# Patient Record
Sex: Male | Born: 1990
Health system: Southern US, Community
[De-identification: ages and names within clinical notes are randomized; demographics above are authoritative.]

## PROBLEM LIST (undated history)

## (undated) DIAGNOSIS — N200 Calculus of kidney: Secondary | ICD-10-CM

---
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
## 2015-11-22 ENCOUNTER — Encounter (HOSPITAL_COMMUNITY): Payer: Self-pay | Admitting: Emergency Medicine

## 2015-11-22 ENCOUNTER — Emergency Department (HOSPITAL_COMMUNITY)
Admission: EM | Admit: 2015-11-22 | Discharge: 2015-11-22 | Disposition: A | Discharge status: Home / Self Care | Payer: Self-pay | Attending: Emergency Medicine | Admitting: Emergency Medicine

## 2015-11-22 ENCOUNTER — Emergency Department (HOSPITAL_COMMUNITY): Payer: Self-pay

## 2015-11-22 DIAGNOSIS — N23 Unspecified renal colic: Secondary | ICD-10-CM | POA: Insufficient documentation

## 2015-11-22 HISTORY — DX: Calculus of kidney: N20.0

## 2015-11-22 LAB — CBC
HCT: 42.7 % (ref 39.0–52.0)
Hemoglobin: 14.6 g/dL (ref 13.0–17.0)
MCH: 30.9 pg (ref 26.0–34.0)
MCHC: 34.2 g/dL (ref 30.0–36.0)
MCV: 90.3 fL (ref 78.0–100.0)
Platelets: 290 10*3/uL (ref 150–400)
RBC: 4.73 MIL/uL (ref 4.22–5.81)
RDW: 12.5 % (ref 11.5–15.5)
WBC: 14.7 10*3/uL — ABNORMAL HIGH (ref 4.0–10.5)

## 2015-11-22 LAB — URINE MICROSCOPIC-ADD ON: SQUAMOUS EPITHELIAL / LPF: NONE SEEN

## 2015-11-22 LAB — LIPASE, BLOOD: Lipase: 15 U/L (ref 11–51)

## 2015-11-22 LAB — URINALYSIS, ROUTINE W REFLEX MICROSCOPIC
Bilirubin Urine: NEGATIVE
Glucose, UA: NEGATIVE mg/dL
Ketones, ur: >80 mg/dL — AB
Leukocytes, UA: NEGATIVE
Nitrite: NEGATIVE
Protein, ur: NEGATIVE mg/dL
Specific Gravity, Urine: 1.033 — ABNORMAL HIGH (ref 1.005–1.030)
pH: 8 (ref 5.0–8.0)

## 2015-11-22 LAB — BASIC METABOLIC PANEL
Anion gap: 12 (ref 5–15)
BUN: 9 mg/dL (ref 6–20)
CO2: 22 mmol/L (ref 22–32)
Calcium: 9.4 mg/dL (ref 8.9–10.3)
Chloride: 104 mmol/L (ref 101–111)
Creatinine, Ser: 1.21 mg/dL (ref 0.61–1.24)
GFR calc Af Amer: >60 mL/min (ref >60)
GFR calc non Af Amer: >60 mL/min (ref >60)
Glucose, Bld: 119 mg/dL — ABNORMAL HIGH (ref 65–99)
Potassium: 3.2 mmol/L — ABNORMAL LOW (ref 3.5–5.1)
Sodium: 138 mmol/L (ref 135–145)

## 2015-11-22 LAB — HEPATIC FUNCTION PANEL
ALK PHOS: 63 U/L (ref 38–126)
ALT: 15 U/L — AB (ref 17–63)
AST: 21 U/L (ref 15–41)
Albumin: 4.9 g/dL (ref 3.5–5.0)
BILIRUBIN INDIRECT: 1.2 mg/dL — AB (ref 0.3–0.9)
BILIRUBIN TOTAL: 1.4 mg/dL — AB (ref 0.3–1.2)
Bilirubin, Direct: 0.2 mg/dL (ref 0.1–0.5)
TOTAL PROTEIN: 7.4 g/dL (ref 6.5–8.1)

## 2015-11-22 MED ORDER — HYDROCODONE-ACETAMINOPHEN 5-325 MG PO TABS: 1.0000 | ORAL_TABLET | Freq: Four times a day (QID) | ORAL | 0 refills | Status: AC | PRN

## 2015-11-22 MED ORDER — IOPAMIDOL (ISOVUE-300) INJECTION 61%
100.0000 mL | Freq: Once | INTRAVENOUS | Status: AC | PRN
  Administered 2015-11-22: 100 mL via INTRAVENOUS

## 2015-11-22 MED ORDER — IBUPROFEN 800 MG PO TABS: 800.0000 mg | ORAL_TABLET | Freq: Three times a day (TID) | ORAL | 0 refills | Status: DC | PRN

## 2015-11-22 MED ORDER — METOCLOPRAMIDE HCL 5 MG/ML IJ SOLN
5.0000 mg | Freq: Once | INTRAMUSCULAR | Status: AC
  Administered 2015-11-22: 5 mg via INTRAVENOUS
  Filled 2015-11-22: qty 2

## 2015-11-22 MED ORDER — TAMSULOSIN HCL 0.4 MG PO CAPS: 0.4000 mg | ORAL_CAPSULE | Freq: Every day | ORAL | 0 refills | Status: AC

## 2015-11-22 MED ORDER — SODIUM CHLORIDE 0.9 % IV BOLUS (SEPSIS)
1000.0000 mL | Freq: Once | INTRAVENOUS | Status: AC
  Administered 2015-11-22: 1000 mL via INTRAVENOUS

## 2015-11-22 MED ORDER — POTASSIUM CHLORIDE CRYS ER 20 MEQ PO TBCR
40.0000 meq | EXTENDED_RELEASE_TABLET | Freq: Once | ORAL | Status: AC
  Administered 2015-11-22: 40 meq via ORAL
  Filled 2015-11-22: qty 2

## 2015-11-22 MED ORDER — HYDROMORPHONE HCL 1 MG/ML IJ SOLN
1.0000 mg | Freq: Once | INTRAMUSCULAR | Status: AC
  Administered 2015-11-22: 1 mg via INTRAVENOUS
  Filled 2015-11-22: qty 1

## 2015-11-22 MED ORDER — KETOROLAC TROMETHAMINE 30 MG/ML IJ SOLN
30.0000 mg | Freq: Once | INTRAMUSCULAR | Status: AC
  Administered 2015-11-22: 30 mg via INTRAVENOUS
  Filled 2015-11-22: qty 1

## 2015-11-22 NOTE — ED Notes (Signed)
Bed: WA17 Expected date: 11/22/15 Expected time: 11:28 AM Means of arrival: Ambulance Comments: Flank Pain

## 2015-11-22 NOTE — ED Triage Notes (Signed)
Patient from home woke up with flank and abdominal pain this morning.  He urinated once and saw a small amount of blood and has not been able to urinate except for small amounts since.  History of kidney stones, patient has nausea but has not vomited.

## 2015-11-22 NOTE — ED Provider Notes (Signed)
[facility]-EMERGENCY DEPT Provider Note   CSN: [id number] Arrival date & time: 11/22/15  1133     History   Chief Complaint Chief Complaint  Patient presents with  . Flank Pain    HPI William Parrish is a 25 y.o. male.Complains of left-sided abdominal pain rating to left back onset 8 AM today. Pain is worse with movement or changing positions not improved by anything. He's vomited one time since onset of pain. He thinks he may have had a slight amount of hematuria. Denies other associated symptoms. No treatment prior to coming here. He's never had similar pain in the past. Brought by EMS  HPI  Past Medical History:  Diagnosis Date  . Kidney stones     There are no active problems to display for this patient.   No past surgical history on file.     Home Medications    Prior to Admission medications   Not on File    Family History No family history on file.  Social History Social History  Substance Use Topics  . Smoking status: Not on file  . Smokeless tobacco: Not on file  . Alcohol use Not on file   Admits to smoking, admits to cocaine use last time last night, admits to alcoholic last time last night  Allergies   Review of patient's allergies indicates not on file.   Review of Systems Review of Systems  Constitutional: Negative.   HENT: Negative.   Respiratory: Negative.   Cardiovascular: Negative.   Gastrointestinal: Positive for abdominal pain and vomiting.       Vomited one time no hematemesis  Genitourinary: Positive for flank pain.  Skin: Negative.   Neurological: Negative.   Psychiatric/Behavioral: Negative.   All other systems reviewed and are negative.    Physical Exam Updated Vital Signs BP 131/81 (BP Location: Left Arm)   Pulse 68   Temp 97.6 F (36.4 C) (Oral)   Resp 18   SpO2 100%   Physical Exam  Constitutional: He appears well-developed and well-nourished. He appears distressed.  Alert Glasgow Coma Score 15 appears  uncomfortable  HENT:  Head: Normocephalic and atraumatic.  Eyes: Conjunctivae are normal. Pupils are equal, round, and reactive to light.  Neck: Neck supple. No tracheal deviation present. No thyromegaly present.  Cardiovascular: Normal rate and regular rhythm.   No murmur heard. Pulmonary/Chest: Effort normal and breath sounds normal.  Abdominal: Soft. Bowel sounds are normal. He exhibits no distension and no mass. There is tenderness. There is no rebound and no guarding.  Tender at left upper quadrant and left lower quadrant  Genitourinary: Penis normal.  Genitourinary Comments: Normal male genitalia  Musculoskeletal: Normal range of motion. He exhibits no edema or tenderness.  Neurological: He is alert. Coordination normal.  Skin: Skin is warm and dry. No rash noted.  Psychiatric: He has a normal mood and affect.  Nursing note and vitals reviewed.  Results for orders placed or performed during the hospital encounter of 11/22/15  Urinalysis, Routine w reflex microscopic- may I&O cath if menses  Result Value Ref Range   Color, Urine YELLOW YELLOW   APPearance TURBID (A) CLEAR   Specific Gravity, Urine 1.033 (H) 1.005 - 1.030   pH 8.0 5.0 - 8.0   Glucose, UA NEGATIVE NEGATIVE mg/dL   Hgb urine dipstick LARGE (A) NEGATIVE   Bilirubin Urine NEGATIVE NEGATIVE   Ketones, ur >80 (A) NEGATIVE mg/dL   Protein, ur NEGATIVE NEGATIVE mg/dL   Nitrite NEGATIVE NEGATIVE  Leukocytes, UA NEGATIVE NEGATIVE  Basic metabolic panel  Result Value Ref Range   Sodium 138 135 - 145 mmol/L   Potassium 3.2 (L) 3.5 - 5.1 mmol/L   Chloride 104 101 - 111 mmol/L   CO2 22 22 - 32 mmol/L   Glucose, Bld 119 (H) 65 - 99 mg/dL   BUN 9 6 - 20 mg/dL   Creatinine, Ser 1.611.21 0.61 - 1.24 mg/dL   Calcium 9.4 8.9 - 09.610.3 mg/dL   GFR calc non Af Amer >60 >60 mL/min   GFR calc Af Amer >60 >60 mL/min   Anion gap 12 5 - 15  CBC  Result Value Ref Range   WBC 14.7 (H) 4.0 - 10.5 K/uL   RBC 4.73 4.22 - 5.81 MIL/uL    Hemoglobin 14.6 13.0 - 17.0 g/dL   HCT 04.542.7 40.939.0 - 81.152.0 %   MCV 90.3 78.0 - 100.0 fL   MCH 30.9 26.0 - 34.0 pg   MCHC 34.2 30.0 - 36.0 g/dL   RDW 91.412.5 78.211.5 - 95.615.5 %   Platelets 290 150 - 400 K/uL  Lipase, blood  Result Value Ref Range   Lipase 15 11 - 51 U/L  Hepatic function panel  Result Value Ref Range   Total Protein 7.4 6.5 - 8.1 g/dL   Albumin 4.9 3.5 - 5.0 g/dL   AST 21 15 - 41 U/L   ALT 15 (L) 17 - 63 U/L   Alkaline Phosphatase 63 38 - 126 U/L   Total Bilirubin 1.4 (H) 0.3 - 1.2 mg/dL   Bilirubin, Direct 0.2 0.1 - 0.5 mg/dL   Indirect Bilirubin 1.2 (H) 0.3 - 0.9 mg/dL  Urine microscopic-add on  Result Value Ref Range   Squamous Epithelial / LPF NONE SEEN NONE SEEN   WBC, UA 0-5 0 - 5 WBC/hpf   RBC / HPF TOO NUMEROUS TO COUNT 0 - 5 RBC/hpf   Bacteria, UA MANY (A) NONE SEEN   Urine-Other AMORPHOUS URATES/PHOSPHATES    Ct Abdomen Pelvis W Contrast  Result Date: 11/22/2015 CLINICAL DATA:  Left flank and abdominal pain. Mild hematuria. History of kidney stones. EXAM: CT ABDOMEN AND PELVIS WITH CONTRAST TECHNIQUE: Multidetector CT imaging of the abdomen and pelvis was performed using the standard protocol following bolus administration of intravenous contrast. CONTRAST:  100mL ISOVUE-300 IOPAMIDOL (ISOVUE-300) INJECTION 61% COMPARISON:  None. FINDINGS: Lower chest: No acute abnormality. Hepatobiliary: Periportal edema is identified. The hepatic veins are not opacified. The portal vein is normal. The gallbladder is unremarkable. A few tiny cysts are seen in the liver. 13 mm of enhancement is seen in the liver on series 2, image 26, likely flash filling within the hemangioma or a vascular anomaly. The attenuation of the liver is heterogeneous. Pancreas: Unremarkable. No pancreatic ductal dilatation or surrounding inflammatory changes. Spleen: Normal in size without focal abnormality. Adrenals/Urinary Tract: A 3 mm stone is seen in the lower left kidney on coronal image 82. No other  renal stones. There is mild hydronephrosis on the left without significant perinephric stranding. There is prominence of the left ureter as well. There is a 4 mm stone just within the left side of the bladder. The bladder is otherwise normal. No obstruction on the right. Stomach/Bowel: Stomach is within normal limits. Appendix appears normal. No evidence of bowel wall thickening, distention, or inflammatory changes. The appendix is normal. Vascular/Lymphatic: The iliac veins and IVC are prominent. No evidence of obstruction. The renal veins are also prominent. The abdominal aorta is normal  in appearance. No adenopathy. Reproductive: Prostate is unremarkable. Other: No abdominal wall hernia or abnormality. No abdominopelvic ascites. Musculoskeletal: No acute or significant osseous findings. IMPRESSION: 1. 4 mm stone just in the left side of the bladder, next to the UVJ, consistent with a recently passed stone. This explains the mild left hydronephrosis and left flank pain. 2. Heterogeneous attenuation of the liver with periportal edema. Given a normal size heart and the prominence of the venous system, this could be due to aggressive hydration. Recommend clinical correlation. 3. 13 mm enhancing lesion in the liver as described above is nonspecific but may represent a flash filling hemangioma or a vascular anomaly. Electronically Signed   By: Gerome Geovanni Rahming III M.D   On: 11/22/2015 13:37    ED Treatments / Results  Labs (all labs ordered are listed, but only abnormal results are displayed) Labs Reviewed  URINALYSIS, ROUTINE W REFLEX MICROSCOPIC (NOT AT [facility])  BASIC METABOLIC PANEL  CBC  LIPASE, BLOOD  HEPATIC FUNCTION PANEL    EKG  EKG Interpretation None       Radiology No results found.  Procedures Procedures (including critical care time)  Medications Ordered in ED Medications  HYDROmorphone (DILAUDID) injection 1 mg (not administered)  sodium chloride 0.9 % bolus 1,000 mL (not  administered)  metoCLOPramide (REGLAN) injection 5 mg (not administered)     Initial Impression / Assessment and Plan / ED Course  I have reviewed the triage vital signs and the nursing notes. 4:10 PM patient resting comfortably after treatment with intravenous Reglan, , intravenous opioids and IV Toradol. He feels ready to go home. He also received oral potassium supplement Pertinent labs & imaging results that were available during my care of the patient were reviewed by me and considered in my medical decision making (see chart for details).  Clinical Course  Plan prescriptions ibuprofen, Norco, Flomax, urine strainer, referral [facility]    Final Clinical Impressions(s) / ED Diagnoses  Diagnosis #1 ureteral colic #2 hypokalemia Final diagnoses:  None    New Prescriptions New Prescriptions   No medications on file     Doug Sou, MD 11/22/15 [phone]

## 2015-11-22 NOTE — Discharge Instructions (Signed)
Strain all urine if you catch a stone, save it and give it to the urologist. Call [facility] in 2 days to schedule next available appointment. Return if your pain is not well controlled with the medications prescribed

## 2015-11-22 NOTE — ED Notes (Signed)
Pt cannot use restroom at this time, aware urine specimen is needed.  

## 2016-12-10 ENCOUNTER — Emergency Department (HOSPITAL_BASED_OUTPATIENT_CLINIC_OR_DEPARTMENT_OTHER): Payer: Self-pay

## 2016-12-10 ENCOUNTER — Emergency Department (HOSPITAL_BASED_OUTPATIENT_CLINIC_OR_DEPARTMENT_OTHER)
Admission: EM | Admit: 2016-12-10 | Discharge: 2016-12-10 | Disposition: A | Discharge status: Home / Self Care | Payer: Self-pay | Attending: Emergency Medicine | Admitting: Emergency Medicine

## 2016-12-10 ENCOUNTER — Encounter (HOSPITAL_BASED_OUTPATIENT_CLINIC_OR_DEPARTMENT_OTHER): Payer: Self-pay | Admitting: *Deleted

## 2016-12-10 DIAGNOSIS — Z79899 Other long term (current) drug therapy: Secondary | ICD-10-CM | POA: Insufficient documentation

## 2016-12-10 DIAGNOSIS — W1789XA Other fall from one level to another, initial encounter: Secondary | ICD-10-CM | POA: Insufficient documentation

## 2016-12-10 DIAGNOSIS — Y939 Activity, unspecified: Secondary | ICD-10-CM | POA: Insufficient documentation

## 2016-12-10 DIAGNOSIS — Y929 Unspecified place or not applicable: Secondary | ICD-10-CM | POA: Insufficient documentation

## 2016-12-10 DIAGNOSIS — S92352A Displaced fracture of fifth metatarsal bone, left foot, initial encounter for closed fracture: Secondary | ICD-10-CM | POA: Insufficient documentation

## 2016-12-10 DIAGNOSIS — F17200 Nicotine dependence, unspecified, uncomplicated: Secondary | ICD-10-CM | POA: Insufficient documentation

## 2016-12-10 DIAGNOSIS — Y999 Unspecified external cause status: Secondary | ICD-10-CM | POA: Insufficient documentation

## 2016-12-10 MED ORDER — OXYCODONE HCL 5 MG PO TABS: 5.0000 mg | ORAL_TABLET | ORAL | 0 refills | Status: AC | PRN

## 2016-12-10 MED ORDER — IBUPROFEN 800 MG PO TABS
800.0000 mg | ORAL_TABLET | Freq: Once | ORAL | Status: AC
  Administered 2016-12-10: 800 mg via ORAL
  Filled 2016-12-10: qty 1

## 2016-12-10 MED ORDER — OXYCODONE HCL 5 MG PO TABS
5.0000 mg | ORAL_TABLET | Freq: Once | ORAL | Status: AC
  Administered 2016-12-10: 5 mg via ORAL
  Filled 2016-12-10: qty 1

## 2016-12-10 MED ORDER — IBUPROFEN 600 MG PO TABS: 600.0000 mg | ORAL_TABLET | Freq: Four times a day (QID) | ORAL | 0 refills | Status: AC | PRN

## 2016-12-10 MED FILL — IBUPROFEN 600 MG TABLET: 600 | 7 days supply | Qty: 30 | Fill #0

## 2016-12-10 MED FILL — oxyCODONE HCL 5 MG TABS: 5 | 2 days supply | Qty: 10 | Fill #0

## 2016-12-10 NOTE — ED Provider Notes (Signed)
[facility] EMERGENCY DEPARTMENT Provider Note   CSN: [id number] Arrival date & time: 12/10/16  1241     History   Chief Complaint Chief Complaint  Patient presents with  . Foot Injury    HPI William Parrish is a 26 y.o. male.  HPI  26 y.o. male, presents to the Emergency Department today due to fall. No head trauma/LOC. Pt fell off the back of his truck this morning and landed on left foot. Notes pain and swelling to outside portion of foot. Rates pain 10/10. Worse with movement. No numbness/tingling. No meds PTA. No other symptoms noted    Past Medical History:  Diagnosis Date  . Kidney stones     There are no active problems to display for this patient.   History reviewed. No pertinent surgical history.     Home Medications    Prior to Admission medications   Medication Sig Start Date End Date Taking? Authorizing Provider  HYDROcodone-acetaminophen (NORCO) 5-325 MG tablet Take 1-2 tablets by mouth every 6 (six) hours as needed for severe pain. 11/22/15   Doug Sou, MD  ibuprofen (ADVIL,MOTRIN) 800 MG tablet Take 1 tablet (800 mg total) by mouth every 8 (eight) hours as needed for moderate pain (Take with food). 11/22/15   Doug Sou, MD  tamsulosin (FLOMAX) 0.4 MG CAPS capsule Take 1 capsule (0.4 mg total) by mouth daily. 11/22/15   Doug Sou, MD    Family History No family history on file.  Social History Social History  Substance Use Topics  . Smoking status: Current Every Day Smoker  . Smokeless tobacco: Never Used  . Alcohol use Not on file     Allergies   Patient has no known allergies.   Review of Systems Review of Systems ROS reviewed and all are negative for acute change except as noted in the HPI.  Physical Exam Updated Vital Signs BP (!) 141/90   Pulse 76   Temp 98.5 F (36.9 C) (Oral)   Resp 16   Ht 5\' 9"  (1.753 m)   Wt 68 kg (150 lb)   SpO2 99%   BMI 22.15 kg/m   Physical Exam  Constitutional: He  is oriented to person, place, and time. Vital signs are normal. He appears well-developed and well-nourished.  HENT:  Head: Normocephalic.  Right Ear: Hearing normal.  Left Ear: Hearing normal.  Eyes: Pupils are equal, round, and reactive to light. Conjunctivae and EOM are normal.  Cardiovascular: Normal rate and regular rhythm.   Pulmonary/Chest: Effort normal.  Musculoskeletal:  Left foot TTP along 5th metatarsal. No swelling or deformity noted. NVI. Cap refill <2sec  Neurological: He is alert and oriented to person, place, and time.  Skin: Skin is warm and dry.  Psychiatric: He has a normal mood and affect. His speech is normal and behavior is normal. Thought content normal.  Nursing note and vitals reviewed.    ED Treatments / Results  Labs (all labs ordered are listed, but only abnormal results are displayed) Labs Reviewed - No data to display  EKG  EKG Interpretation None       Radiology Dg Foot Complete Left  Result Date: 12/10/2016 CLINICAL DATA:  Injured foot.  Swelling and pain. EXAM: LEFT FOOT - COMPLETE 3+ VIEW COMPARISON:  No recent . FINDINGS: Displaced fracture of the base of the left fifth metatarsal noted. No other acute abnormalities identified. IMPRESSION: Displaced fracture of the base of the left fifth metatarsal. Electronically Signed   By: Maisie Fus  Register   On: 12/10/2016 13:06    Procedures Procedures (including critical care time)  Medications Ordered in ED Medications - No data to display   Initial Impression / Assessment and Plan / ED Course  I have reviewed the triage vital signs and the nursing notes.  Pertinent labs & imaging results that were available during my care of the patient were reviewed by me and considered in my medical decision making (see chart for details).  Final Clinical Impressions(s) / ED Diagnoses   {I have reviewed and evaluated the relevant imaging studies.  {I have reviewed the relevant previous healthcare records.   {I obtained HPI from historian.   ED Course:  Assessment: Patient X-Ray shows displaced fracture of base of 5th metatarsal on left foot. Given Post op shoe and crutches. Pt advised to follow up with orthopedics. Conservative therapy recommended and discussed. Patient will be discharged home & is agreeable with above plan. Returns precautions discussed. Pt appears safe for discharge  Disposition/Plan:  DC Home Additional Verbal discharge instructions given and discussed with patient.  Pt Instructed to f/u with PCP in the next week for evaluation and treatment of symptoms. Return precautions given Pt acknowledges and agrees with plan  Supervising Physician Benjiman CorePickering, Nathan, MD  Final diagnoses:  Displaced fracture of fifth metatarsal bone, left foot, initial encounter for closed fracture    New Prescriptions New Prescriptions   No medications on file     Audry PiliMohr, Darielle Hancher, Cordelia Poche-C 12/10/16 1354    Benjiman CorePickering, Nathan, MD 12/10/16 1654

## 2016-12-10 NOTE — ED Notes (Signed)
ED Provider at bedside. 

## 2016-12-10 NOTE — Discharge Instructions (Signed)
Please read and follow all provided instructions.  Your diagnoses today include:  1. Displaced fracture of fifth metatarsal bone, left foot, initial encounter for closed fracture    Tests performed today include: Vital signs. See below for your results today.   Medications prescribed:  Take as prescribed   Home care instructions:  Follow any educational materials contained in this packet.  Follow-up instructions: Please follow-up with Orthopedics for further evaluation of symptoms and treatment   Return instructions:  Please return to the Emergency Department if you do not get better, if you get worse, or new symptoms OR  - Fever (temperature greater than 101.78F)  - Bleeding that does not stop with holding pressure to the area    -Severe pain (please note that you may be more sore the day after your accident)  - Chest Pain  - Difficulty breathing  - Severe nausea or vomiting  - Inability to tolerate food and liquids  - Passing out  - Skin becoming red around your wounds  - Change in mental status (confusion or lethargy)  - New numbness or weakness    Please return if you have any other emergent concerns.  Additional Information:  Your vital signs today were: BP (!) 141/90    Pulse 76    Temp 98.5 F (36.9 C) (Oral)    Resp 16    Ht 5\' 9"  (1.753 m)    Wt 68 kg (150 lb)    SpO2 99%    BMI 22.15 kg/m  If your blood pressure (BP) was elevated above 135/85 this visit, please have this repeated by your doctor within one month. ---------------

## 2016-12-10 NOTE — ED Notes (Signed)
Pt directed to pharmacy to pick up RX. Pt has a ride with him

## 2016-12-10 NOTE — ED Triage Notes (Signed)
He fell off the back of his truck this am. Swelling and pain to the lateral aspect of his left foot.

## 2017-02-15 IMAGING — CT CT ABD-PELV W/ CM
2 of 4 series · 15 of 46 positions shown, 17 images · IV contrast (iopamidol)
Comparison: None.

CLINICAL DATA: Left flank and abdominal pain. Mild hematuria.
History of kidney stones.

EXAM:
CT ABDOMEN AND PELVIS WITH CONTRAST
TECHNIQUE: Multidetector CT imaging of the abdomen and pelvis was performed
using the standard protocol following bolus administration of
intravenous contrast.
CONTRAST:  100mL 7LQF2C-600 IOPAMIDOL (7LQF2C-600) INJECTION 61%

[Series 2: abd/pel with · axial · 0.74mm/px · z∈[-409,-14]mm · 12 of 89 slices shown, 14 images]
[im 5/89  soft-tissue]
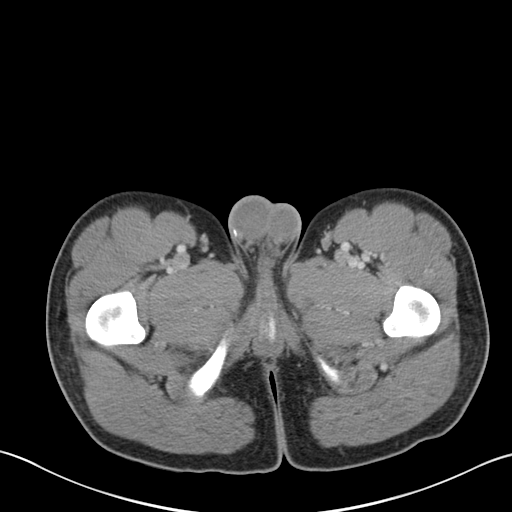
[im 5/89  bone]
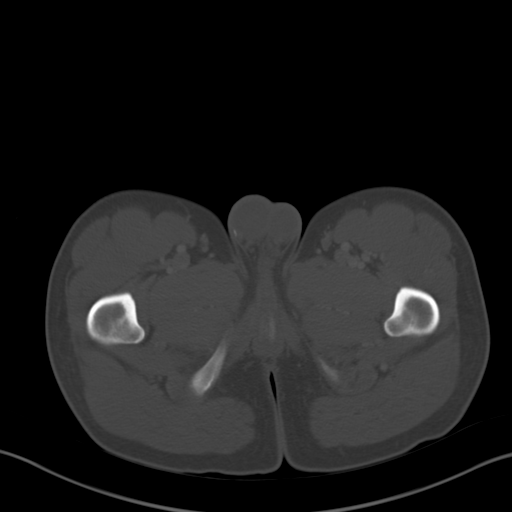
[im 15/89  soft-tissue]
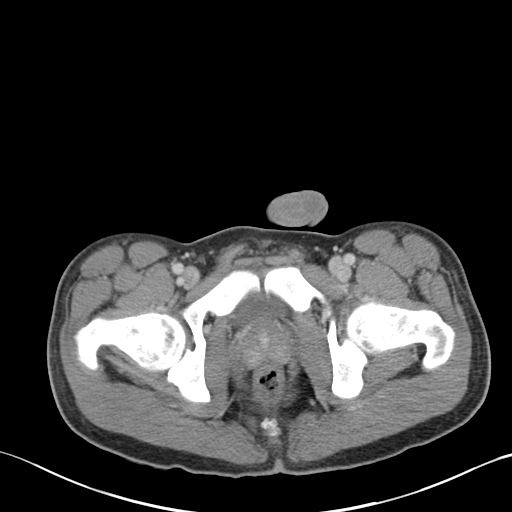
[im 20/89  soft-tissue]
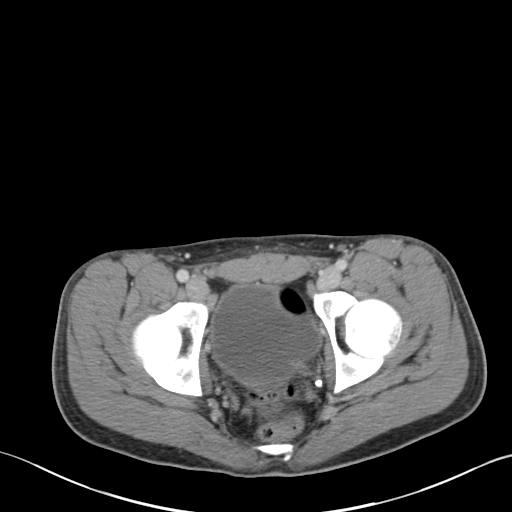
[im 25/89  soft-tissue]
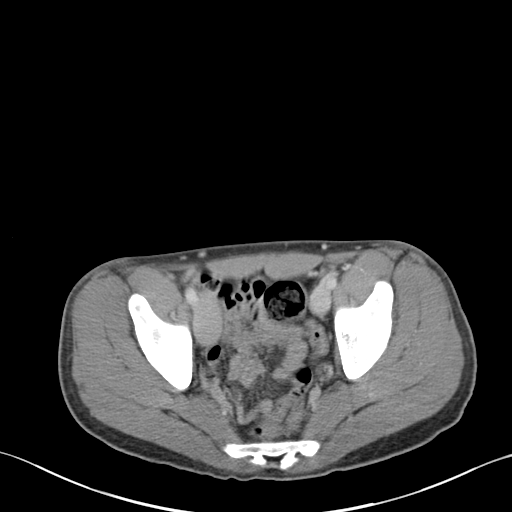
[im 35/89  soft-tissue]
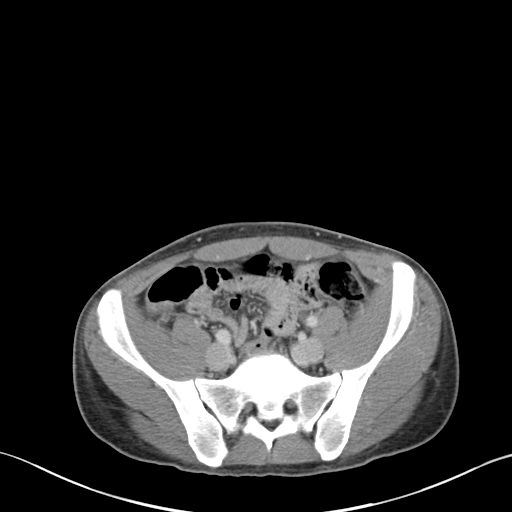
[im 40/89  soft-tissue]
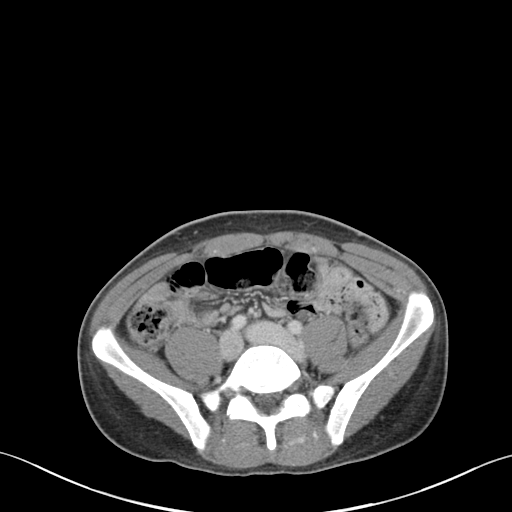
[im 49/89  soft-tissue]
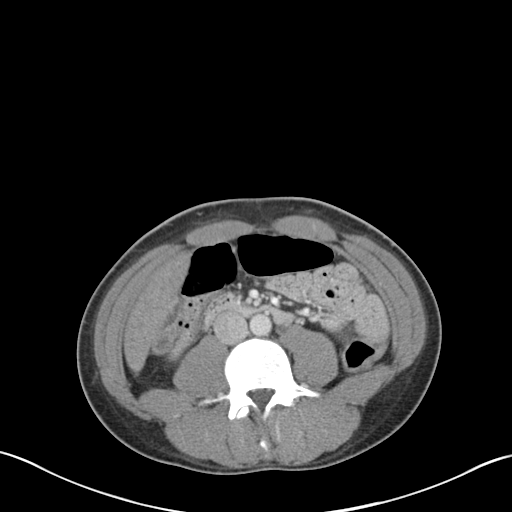
[im 54/89  soft-tissue]
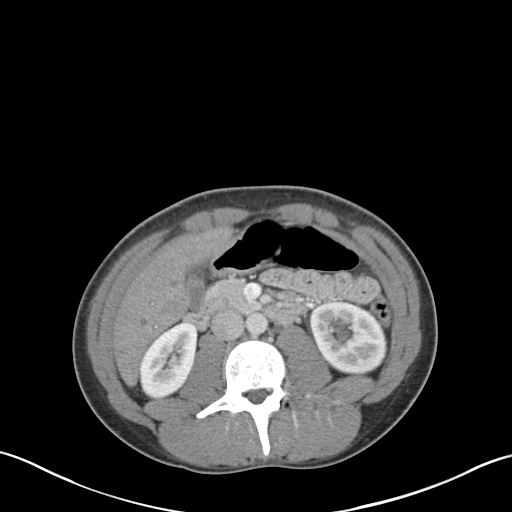
[im 64/89  soft-tissue]
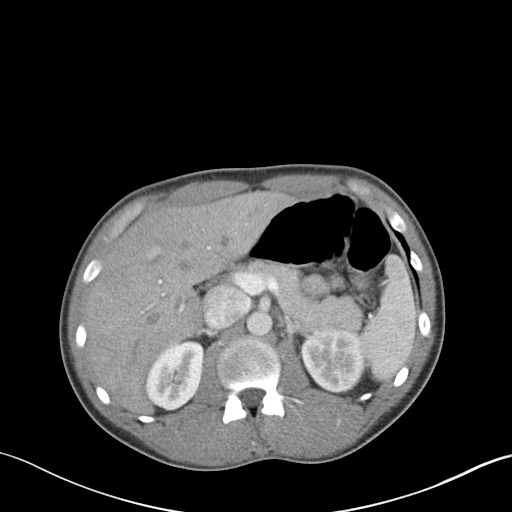
[im 64/89  bone]
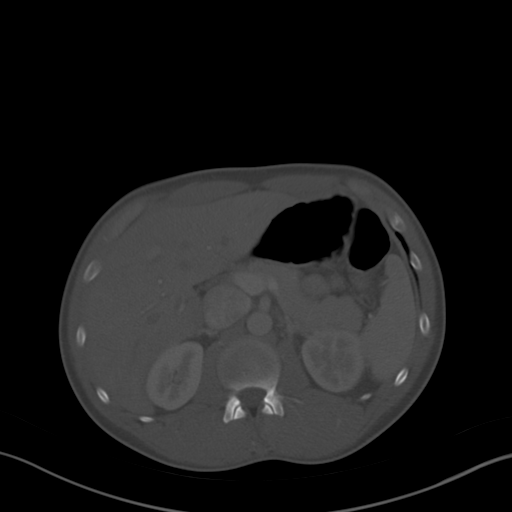
[im 69/89  soft-tissue]
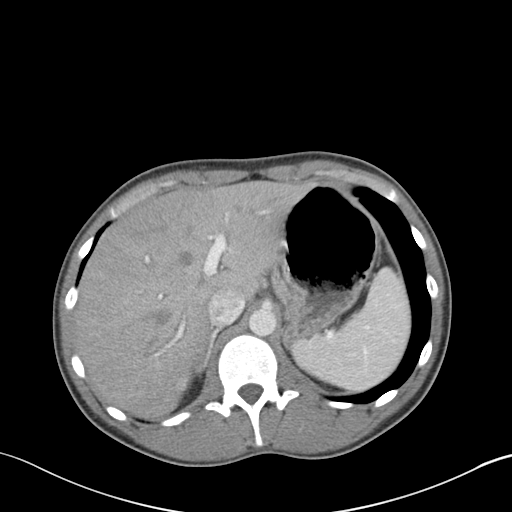
[im 74/89  soft-tissue]
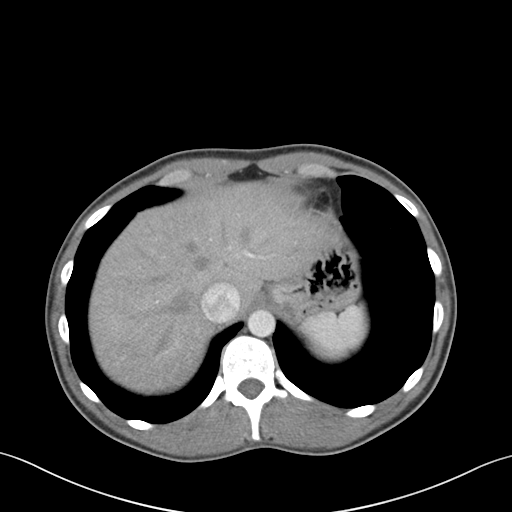
[im 84/89  soft-tissue]
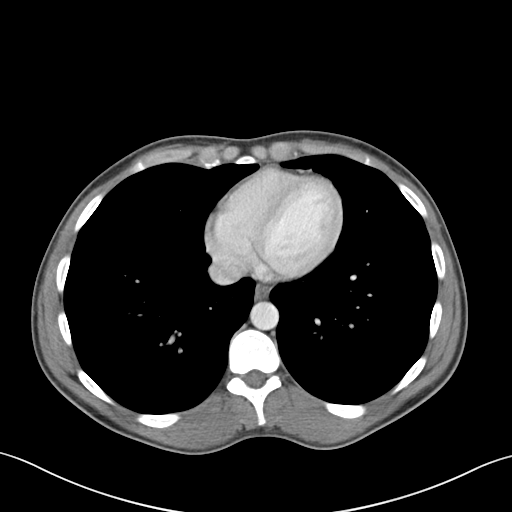

[Series 3: coronal a/|p · coronal · 0.74mm/px · 3 of 139 slices shown]
[im 47/139  soft-tissue]
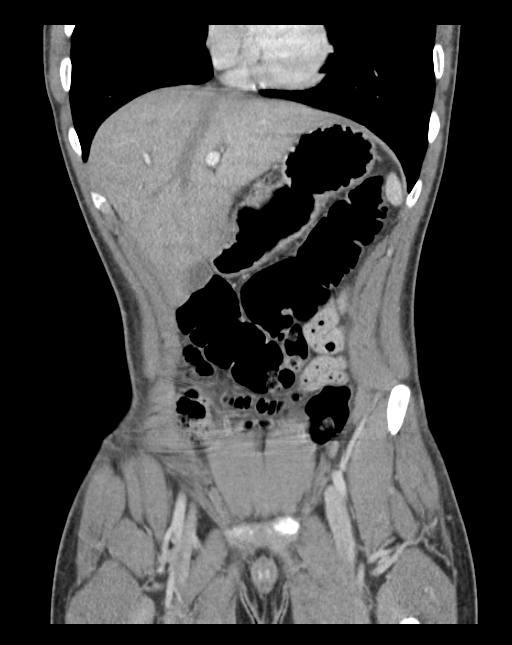
[im 62/139  soft-tissue]
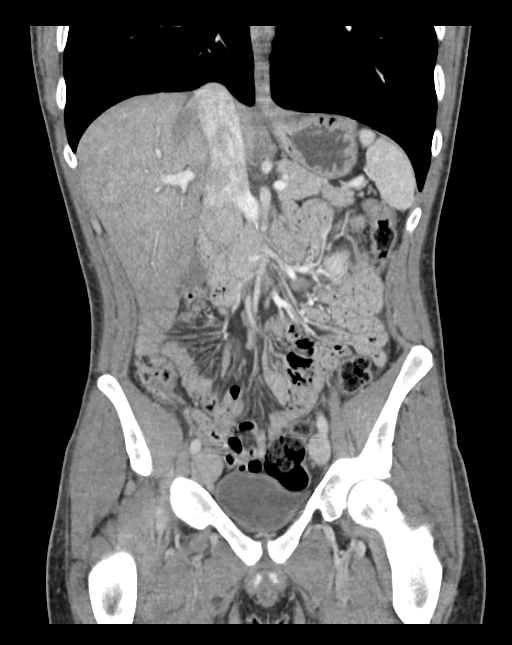
[im 77/139  soft-tissue]
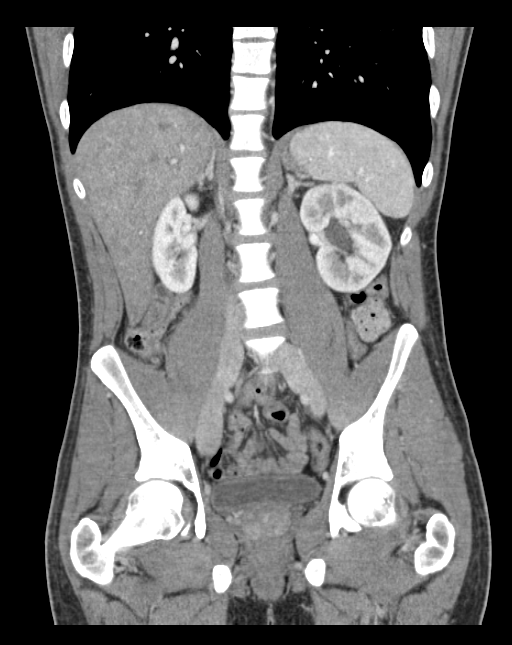

[15 of 46 positions shown; findings below may reference images not displayed]

FINDINGS: Lower chest: No acute abnormality.

Hepatobiliary: Periportal edema is identified. The hepatic veins are
not opacified. The portal vein is normal. The gallbladder is
unremarkable. A few tiny cysts are seen in the liver. 13 mm of
enhancement is seen in the liver on series 2, image 26, likely flash
filling within the hemangioma or a vascular anomaly. The attenuation
of the liver is heterogeneous.

Pancreas: Unremarkable. No pancreatic ductal dilatation or
surrounding inflammatory changes.

Spleen: Normal in size without focal abnormality.

Adrenals/Urinary Tract: A 3 mm stone is seen in the lower left
kidney on coronal image 82. No other renal stones. There is mild
hydronephrosis on the left without significant perinephric
stranding. There is prominence of the left ureter as well. There is
a 4 mm stone just within the left side of the bladder. The bladder
is otherwise normal. No obstruction on the right.

Stomach/Bowel: Stomach is within normal limits. Appendix appears
normal. No evidence of bowel wall thickening, distention, or
inflammatory changes. The appendix is normal.

Vascular/Lymphatic: The iliac veins and IVC are prominent. No
evidence of obstruction. The renal veins are also prominent. The
abdominal aorta is normal in appearance. No adenopathy.

Reproductive: Prostate is unremarkable.

Other: No abdominal wall hernia or abnormality. No abdominopelvic
ascites.

Musculoskeletal: No acute or significant osseous findings.
IMPRESSION: 1. 4 mm stone just in the left side of the bladder, next to the UVJ,
consistent with a recently passed stone. This explains the mild left
hydronephrosis and left flank pain.
2. Heterogeneous attenuation of the liver with periportal edema.
Given a normal size heart and the prominence of the venous system,
this could be due to aggressive hydration. Recommend clinical
correlation.
3. 13 mm enhancing lesion in the liver as described above is
nonspecific but may represent a flash filling hemangioma or a
vascular anomaly.

## 2018-03-06 IMAGING — DX DG FOOT COMPLETE 3+V*L*
3 series · 3 of 3 positions shown · non-contrast
Comparison: No recent .

CLINICAL DATA: Injured foot.  Swelling and pain.

EXAM:
LEFT FOOT - COMPLETE 3+ VIEW

[foot ap]
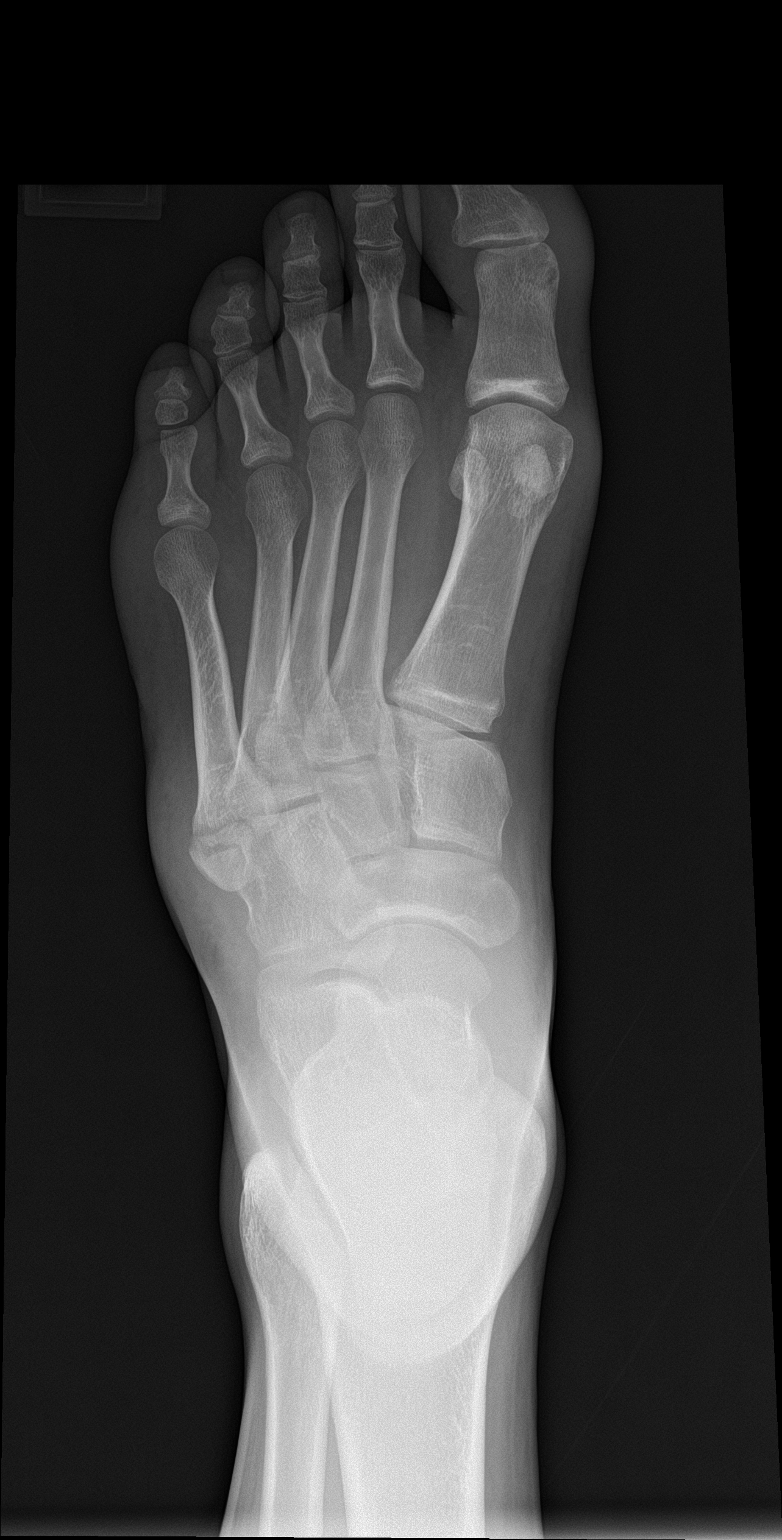

[foot obl]
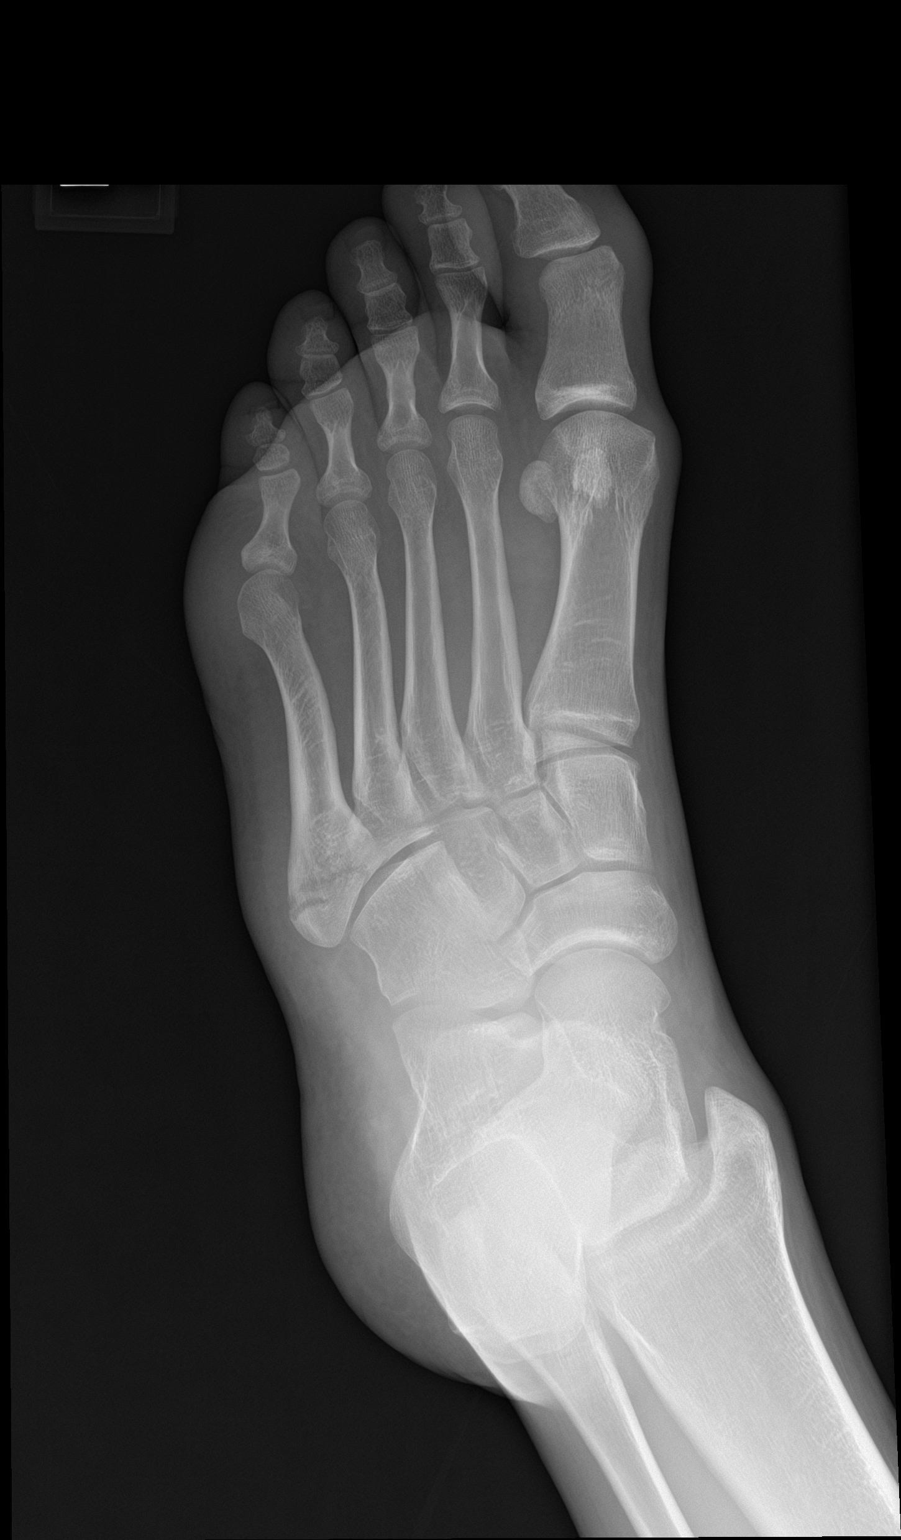

[foot lat]
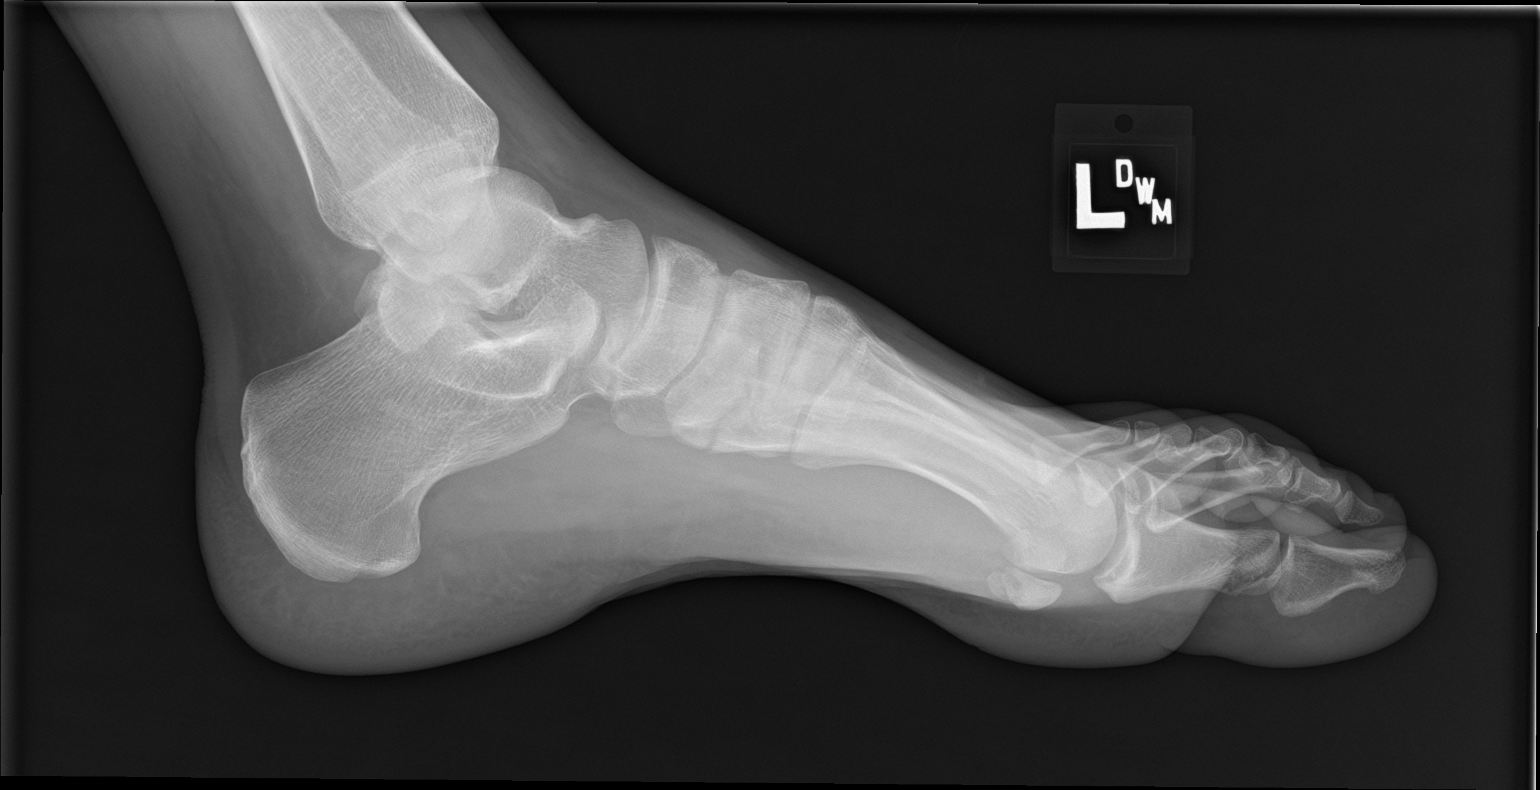

[3 of 3 positions shown; findings below may reference images not displayed]

FINDINGS: Displaced fracture of the base of the left fifth metatarsal noted.
No other acute abnormalities identified.
IMPRESSION: Displaced fracture of the base of the left fifth metatarsal.
